# Patient Record
Sex: Male | Born: 1975 | Race: White | Hispanic: No | Marital: Married | Smoking: Never smoker
Health system: Southern US, Community
[De-identification: ages and names within clinical notes are randomized; demographics above are authoritative.]

---

## 2014-03-01 ENCOUNTER — Ambulatory Visit: Payer: Self-pay | Admitting: General Practice

## 2014-03-01 IMAGING — US ABDOMEN ULTRASOUND LIMITED
1 series · 14 of 25 positions shown · non-contrast
Comparison: None.

CLINICAL DATA: Right upper quadrant pain

EXAM:
US ABDOMEN LIMITED - RIGHT UPPER QUADRANT

[Series 1: abdomen ultrasound limited · 0.28mm/px · 14 of 48 slices shown]
[im 1/48]
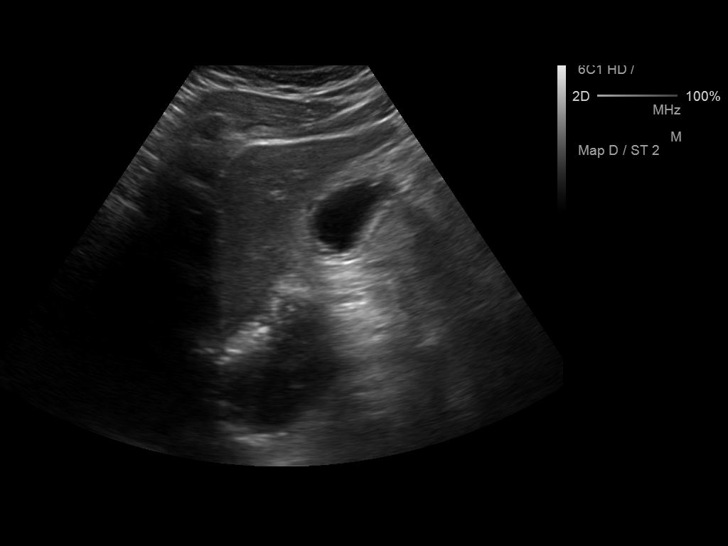
[im 4/48]
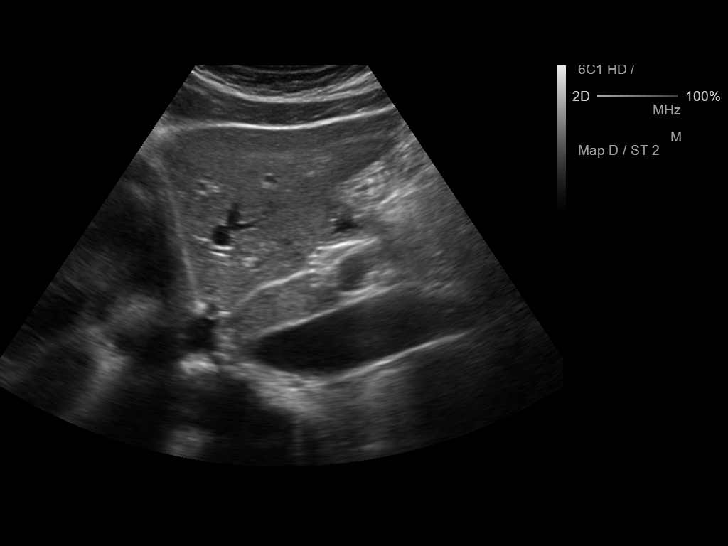
[im 8/48]
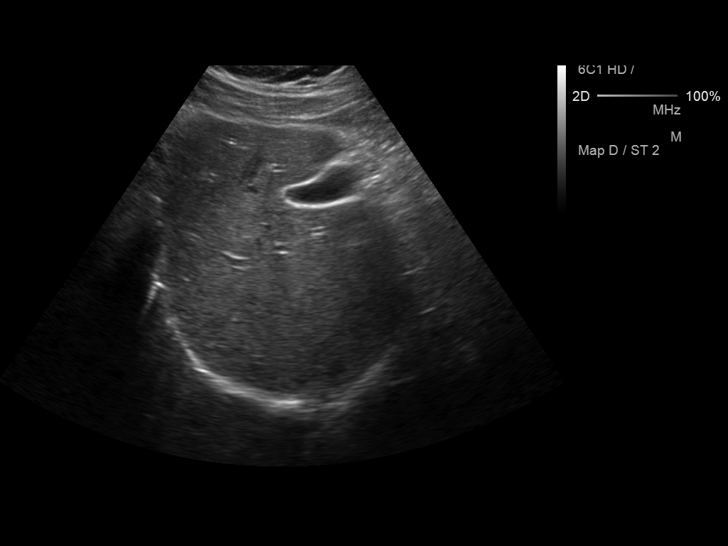
[im 12/48]
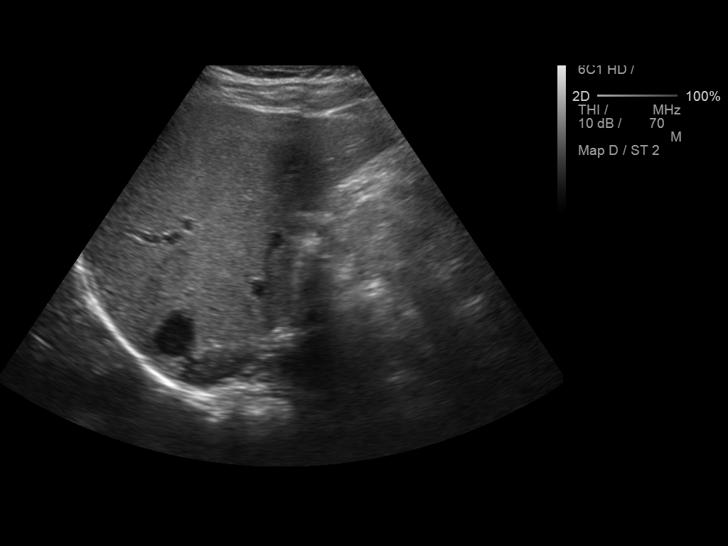
[im 16/48]
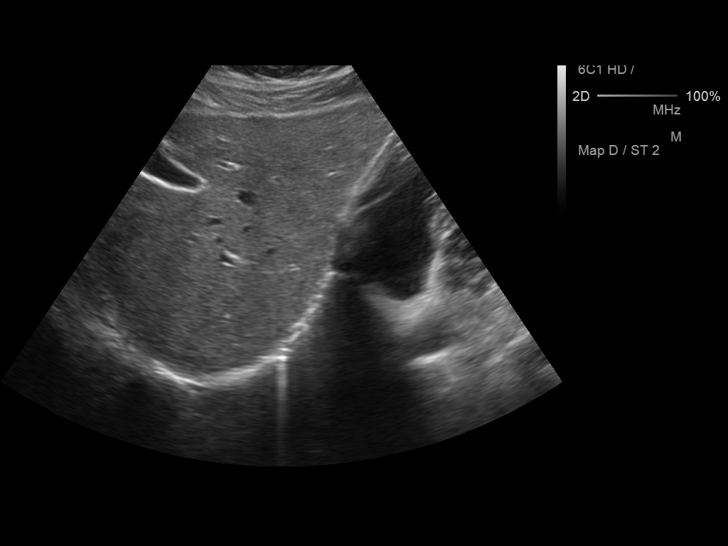
[im 18/48]
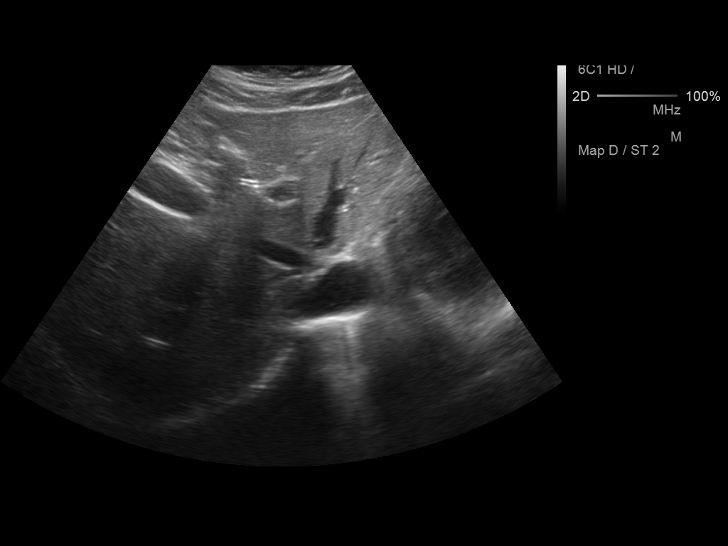
[im 22/48]
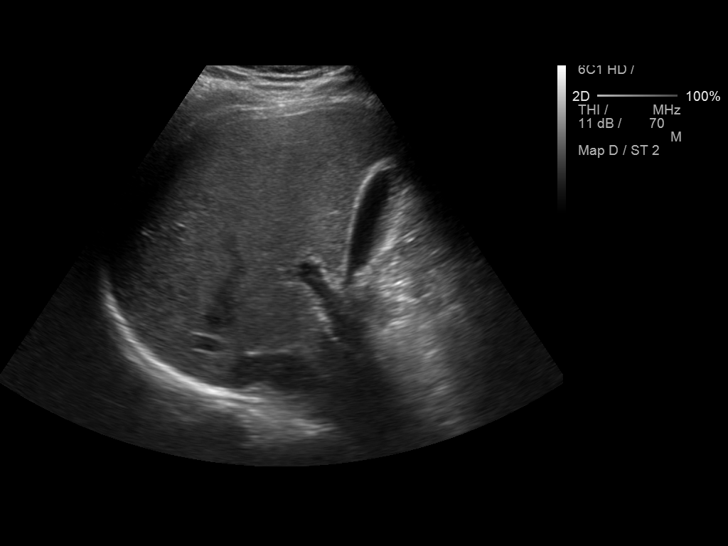
[im 26/48]
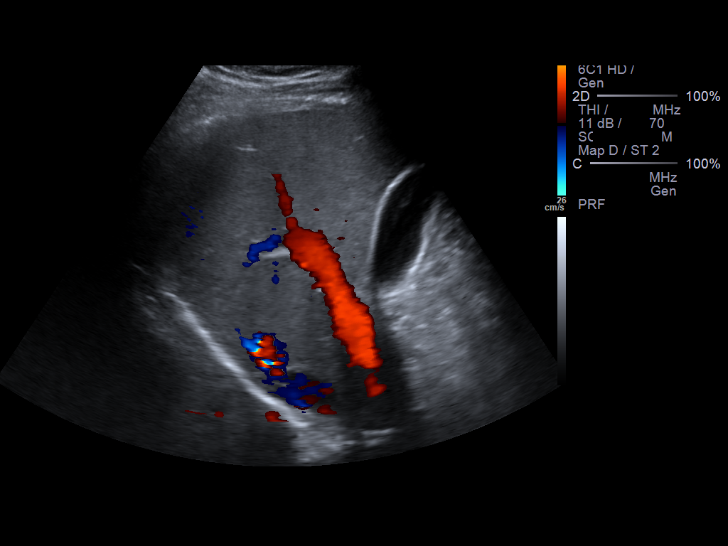
[im 30/48]
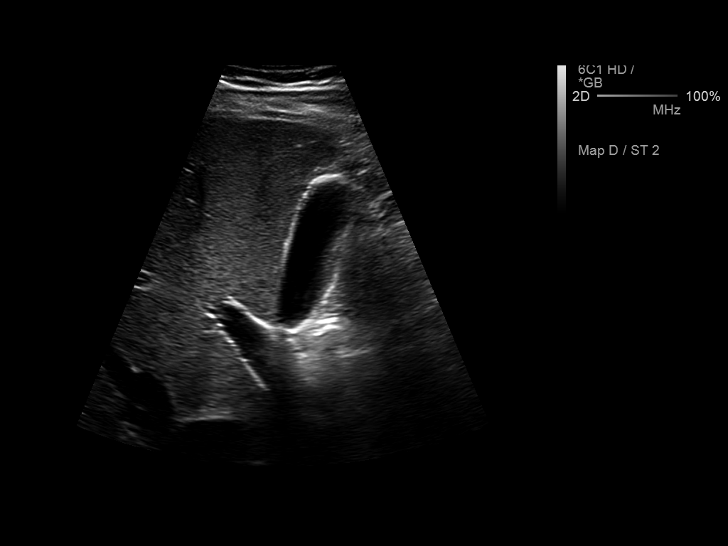
[im 32/48]
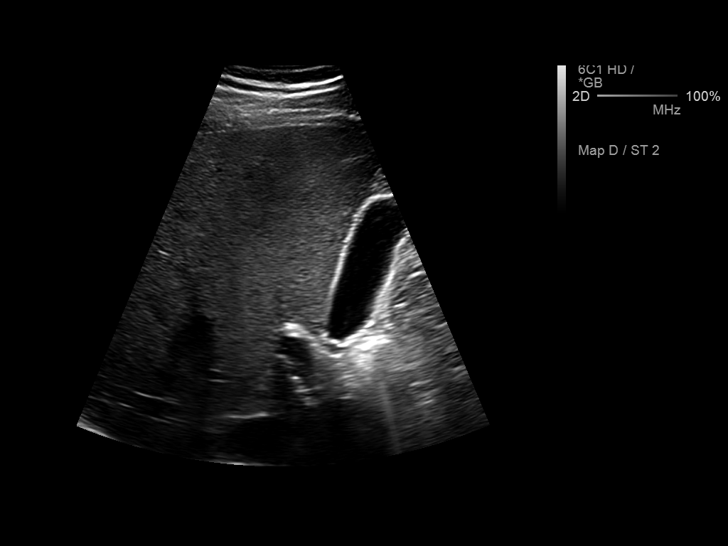
[im 36/48]
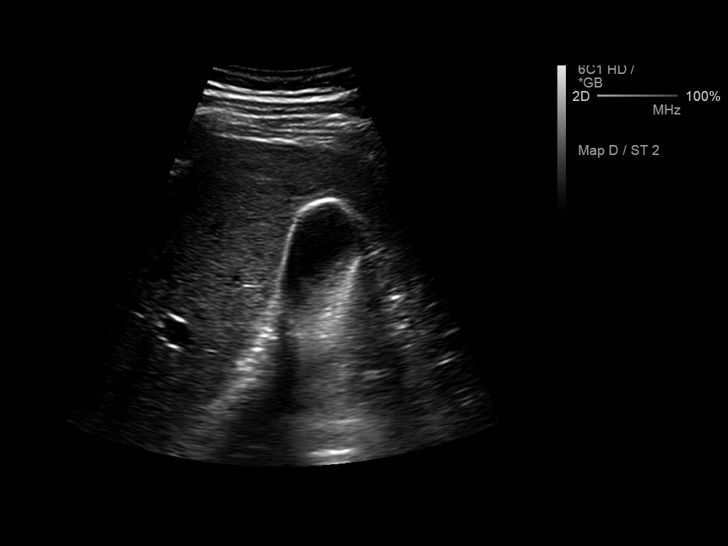
[im 40/48]
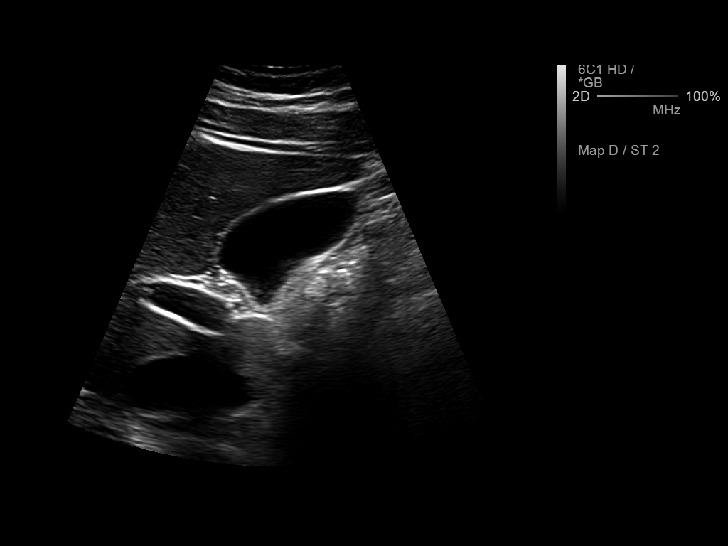
[im 44/48]
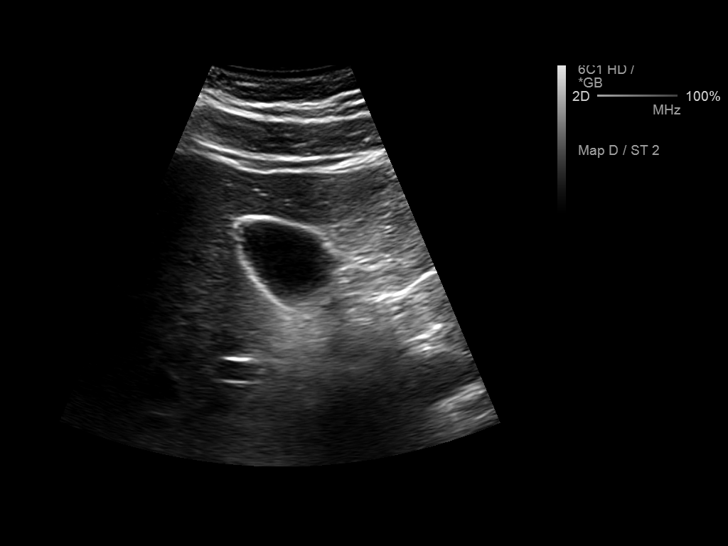
[im 48/48]
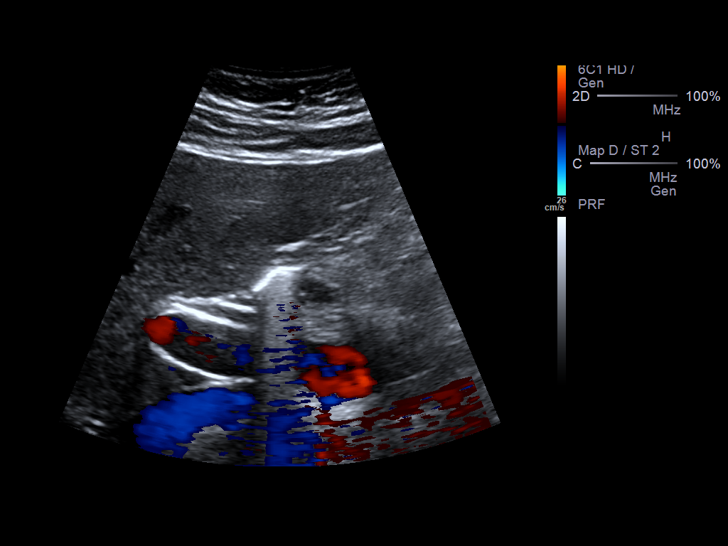

[14 of 25 positions shown; findings below may reference images not displayed]

FINDINGS: Gallbladder:

No gallstones or wall thickening visualized. No sonographic Murphy
sign noted.

Common bile duct:

Diameter: 4.1 mm

Liver:

No focal lesion identified. Within normal limits in parenchymal
echogenicity.
IMPRESSION: Normal

## 2015-09-12 ENCOUNTER — Other Ambulatory Visit: Payer: Self-pay | Admitting: Physician Assistant

## 2015-09-18 ENCOUNTER — Other Ambulatory Visit: Payer: Self-pay | Admitting: Emergency Medicine

## 2015-09-18 DIAGNOSIS — K219 Gastro-esophageal reflux disease without esophagitis: Secondary | ICD-10-CM

## 2015-09-18 MED ORDER — OMEPRAZOLE 20 MG PO CPDR: 20.0000 mg | DELAYED_RELEASE_CAPSULE | Freq: Every day | ORAL | Status: DC

## 2015-09-18 NOTE — Telephone Encounter (Signed)
Received a faxed medication request from [facility].  Please advise.  Thank you.

## 2015-09-18 NOTE — Telephone Encounter (Signed)
Needs fasting labs after this 1 refill

## 2015-10-10 ENCOUNTER — Encounter: Payer: Self-pay | Admitting: Physician Assistant

## 2015-10-10 ENCOUNTER — Ambulatory Visit: Payer: Self-pay | Admitting: Physician Assistant

## 2015-10-10 VITALS — BP 120/70 | HR 58 | Temp 98.4°F

## 2015-10-10 DIAGNOSIS — K219 Gastro-esophageal reflux disease without esophagitis: Secondary | ICD-10-CM

## 2015-10-10 DIAGNOSIS — J302 Other seasonal allergic rhinitis: Secondary | ICD-10-CM

## 2015-10-10 MED ORDER — FLUTICASONE PROPIONATE 50 MCG/ACT NA SUSP: 2.0000 | Freq: Every day | NASAL | Status: DC

## 2015-10-10 MED ORDER — OMEPRAZOLE 20 MG PO CPDR: 20.0000 mg | DELAYED_RELEASE_CAPSULE | Freq: Every day | ORAL | Status: DC

## 2015-10-10 NOTE — Progress Notes (Signed)
S: ? If can have rx for flonase, states zyrtec had stopped working and tried [employer] which really helped, also using omeprazole occasionally/rarely, but rx from last year has run out, has been dieting and exercising, lost 15lbs as started running again  O: vitals wnl, nad, lungs c t a, cv rrr  A: allergies, gerd  P: omeprazole 20mg , flonase, continue diet and exercise, return for yearly labs in Jan/FEb

## 2017-02-24 ENCOUNTER — Ambulatory Visit: Payer: Self-pay | Admitting: Physician Assistant

## 2017-02-24 ENCOUNTER — Encounter: Payer: Self-pay | Admitting: Physician Assistant

## 2017-02-24 ENCOUNTER — Encounter (INDEPENDENT_AMBULATORY_CARE_PROVIDER_SITE_OTHER): Payer: Self-pay

## 2017-02-24 VITALS — BP 125/80 | HR 62 | Temp 97.9°F | Ht 72.0 in | Wt 215.0 lb

## 2017-02-24 DIAGNOSIS — Z008 Encounter for other general examination: Secondary | ICD-10-CM

## 2017-02-24 DIAGNOSIS — Z0189 Encounter for other specified special examinations: Secondary | ICD-10-CM

## 2017-02-24 DIAGNOSIS — Z0000 Encounter for general adult medical examination without abnormal findings: Secondary | ICD-10-CM

## 2017-02-24 NOTE — Progress Notes (Signed)
S: pt here for wellness physical and biometrics for insurance purposes, states he noticed he has night sweats about 2x a week, does spray down well when working outside, no known tick bites;  no other complaints ros neg. 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

## 2017-02-24 NOTE — Addendum Note (Signed)
Addended by: Catha Brow T on: 02/24/2017 02:41 PM   Modules accepted: Orders

## 2017-02-27 LAB — LYME AB/WESTERN BLOT REFLEX
LYME DISEASE AB, QUANT, IGM: <0.8 index (ref 0.00–0.79)
Lyme IgG/IgM Ab: <0.91 {ISR} (ref 0.00–0.90)

## 2017-02-27 LAB — CMP12+LP+TP+TSH+6AC+PSA+CBC…
A/G RATIO: 2.2 (ref 1.2–2.2)
ALBUMIN: 5.1 g/dL (ref 3.5–5.5)
ALK PHOS: 72 IU/L (ref 39–117)
ALT: 33 IU/L (ref 0–44)
AST: 24 IU/L (ref 0–40)
BASOS ABS: 0.1 10*3/uL (ref 0.0–0.2)
BILIRUBIN TOTAL: 0.3 mg/dL (ref 0.0–1.2)
BUN/Creatinine Ratio: 12 (ref 9–20)
BUN: 12 mg/dL (ref 6–24)
Basos: 1 %
CHLORIDE: 101 mmol/L (ref 96–106)
CHOLESTEROL TOTAL: 190 mg/dL (ref 100–199)
Calcium: 10.2 mg/dL (ref 8.7–10.2)
Chol/HDL Ratio: 3.7 ratio (ref 0.0–5.0)
Creatinine, Ser: 1.02 mg/dL (ref 0.76–1.27)
EOS (ABSOLUTE): 0.2 10*3/uL (ref 0.0–0.4)
EOS: 3 %
Estimated CHD Risk: 0.6 times avg. (ref 0.0–1.0)
FREE THYROXINE INDEX: 1.6 (ref 1.2–4.9)
GFR calc non Af Amer: 90 mL/min/{1.73_m2} (ref >59)
GFR, EST AFRICAN AMERICAN: 104 mL/min/{1.73_m2} (ref >59)
GGT: 42 IU/L (ref 0–65)
Globulin, Total: 2.3 g/dL (ref 1.5–4.5)
Glucose: 103 mg/dL — ABNORMAL HIGH (ref 65–99)
HDL: 51 mg/dL (ref >39)
HEMOGLOBIN: 15.5 g/dL (ref 13.0–17.7)
Hematocrit: 45.9 % (ref 37.5–51.0)
IMMATURE GRANS (ABS): 0 10*3/uL (ref 0.0–0.1)
IMMATURE GRANULOCYTES: 0 %
IRON: 84 ug/dL (ref 38–169)
LDH: 168 IU/L (ref 121–224)
LDL CALC: 100 mg/dL — AB (ref 0–99)
LYMPHS: 41 %
Lymphocytes Absolute: 2.4 10*3/uL (ref 0.7–3.1)
MCH: 29.1 pg (ref 26.6–33.0)
MCHC: 33.8 g/dL (ref 31.5–35.7)
MCV: 86 fL (ref 79–97)
MONOCYTES: 8 %
Monocytes Absolute: 0.5 10*3/uL (ref 0.1–0.9)
NEUTROS ABS: 2.8 10*3/uL (ref 1.4–7.0)
NEUTROS PCT: 47 %
PLATELETS: 292 10*3/uL (ref 150–379)
PROSTATE SPECIFIC AG, SERUM: 0.8 ng/mL (ref 0.0–4.0)
Phosphorus: 3.5 mg/dL (ref 2.5–4.5)
Potassium: 4.9 mmol/L (ref 3.5–5.2)
RBC: 5.33 x10E6/uL (ref 4.14–5.80)
RDW: 14 % (ref 12.3–15.4)
Sodium: 143 mmol/L (ref 134–144)
T3 Uptake Ratio: 22 % — ABNORMAL LOW (ref 24–39)
T4, Total: 7.2 ug/dL (ref 4.5–12.0)
TOTAL PROTEIN: 7.4 g/dL (ref 6.0–8.5)
TSH: 2.67 u[IU]/mL (ref 0.450–4.500)
Triglycerides: 194 mg/dL — ABNORMAL HIGH (ref 0–149)
Uric Acid: 5.7 mg/dL (ref 3.7–8.6)
VLDL CHOLESTEROL CAL: 39 mg/dL (ref 5–40)
WBC: 5.8 10*3/uL (ref 3.4–10.8)

## 2017-02-27 LAB — HEPATITIS C ANTIBODY (REFLEX): HCV Ab: <0.1 s/co ratio (ref 0.0–0.9)

## 2017-02-27 LAB — HIV ANTIBODY (ROUTINE TESTING W REFLEX): HIV Screen 4th Generation wRfx: NONREACTIVE

## 2017-02-27 LAB — HCV COMMENT:

## 2017-02-27 LAB — VITAMIN D 25 HYDROXY (VIT D DEFICIENCY, FRACTURES): VIT D 25 HYDROXY: 39.7 ng/mL (ref 30.0–100.0)

## 2017-02-27 LAB — HEPATITIS B SURFACE ANTIBODY,QUALITATIVE: Hep B Surface Ab, Qual: REACTIVE

## 2017-02-27 LAB — SPECIMEN STATUS REPORT

## 2017-02-27 LAB — HGB A1C W/O EAG: HEMOGLOBIN A1C: 5.3 % (ref 4.8–5.6)

## 2017-09-29 ENCOUNTER — Ambulatory Visit: Payer: Self-pay | Admitting: Physician Assistant

## 2017-09-29 ENCOUNTER — Encounter: Payer: Self-pay | Admitting: Physician Assistant

## 2017-09-29 VITALS — BP 134/85 | HR 58 | Temp 98.5°F | Resp 16

## 2017-09-29 DIAGNOSIS — S39012A Strain of muscle, fascia and tendon of lower back, initial encounter: Secondary | ICD-10-CM

## 2017-09-29 MED ORDER — NAPROXEN 500 MG PO TABS: 500.0000 mg | ORAL_TABLET | Freq: Two times a day (BID) | ORAL | 0 refills | Status: DC

## 2017-09-29 MED ORDER — ORPHENADRINE CITRATE ER 100 MG PO TB12: 100.0000 mg | ORAL_TABLET | Freq: Two times a day (BID) | ORAL | 0 refills | Status: DC

## 2017-09-29 NOTE — Progress Notes (Signed)
   Subjective: Back pain     Patient ID: Tyler Stone, male    DOB: [birth year], 41 y.o.   MRN: [id number]  HPI Patient complain of bilateral low back pain secondary to hypertension flexion incident 2 days ago. Patient denies radicular component to his back pain. Patient denies bladder or bowel dysfunction. Patient rates the pain as a 5/10. Patient described a pain as "spasmatic". No palliative measures for complaint.  Review of Systems    seasonal rhinitis and GERD. Objective:   Physical Exam No obvious spinal deformity. No guarding palpation spinal processes. Patient is moderate guarding with guarding of the bilateral paraspinal muscle area. Patient has decreased range of motion with flexion and lateral movements. Patient has negative straight leg test. Patient has normal gait.       Assessment & Plan: Lumbar strain   Patient given discharge Instructions and a prescription for Norflex and naproxen. Patient advised to follow with no improvement 3-5 days.

## 2017-10-07 ENCOUNTER — Ambulatory Visit: Payer: Self-pay | Admitting: Emergency Medicine

## 2017-10-07 ENCOUNTER — Encounter: Payer: Self-pay | Admitting: Emergency Medicine

## 2017-10-07 VITALS — BP 110/70 | HR 68 | Temp 98.5°F | Resp 16

## 2017-10-07 DIAGNOSIS — S39012D Strain of muscle, fascia and tendon of lower back, subsequent encounter: Secondary | ICD-10-CM

## 2017-10-07 MED ORDER — METHOCARBAMOL 500 MG PO TABS: ORAL_TABLET | ORAL | 0 refills | Status: DC

## 2017-10-07 MED ORDER — NAPROXEN 500 MG PO TABS: 500.0000 mg | ORAL_TABLET | Freq: Two times a day (BID) | ORAL | 0 refills | Status: DC

## 2017-10-07 NOTE — Progress Notes (Signed)
S:  Here with back pain.  Was seen last week for the same. Patient states that he was doing  On the medications prescribed for him. This morning when he got out of bed he began having increased back pain in the same area.  He states movement increases pain especially with bending. He denies any paresthesias, radiculopathy, incontinence of bowel or bladder. Patient does not have any muscle realants at this time. He denies any previous urinary symptoms. This pain is the same as he experienced before. O:  Back without gross deformity.  Range of motion is guarded in all planes secondary to discomfort.  Moderate paraspinous tenderness right side lumbar area to palpation. No point tenderness to the vertebral bodies. No CVA tenderness.  Straight leg raises are negative. Reflexes are equal at 2+ bilaterally.  Gait is without assistance and wants patient is up walking is steady. A:  Lumbar strain P:  Patient is continue to take naproxen bid with food.  Robaxin 500mg  1-2 tablets q 6 hours for muscle spasms. He will decrease his work out training for his marathon. No running at this time.Walking only. Ice and heat to his back.If not improving would consider lumbar spine x-rays on next visit.

## 2017-12-12 ENCOUNTER — Ambulatory Visit: Payer: Self-pay

## 2017-12-15 ENCOUNTER — Ambulatory Visit: Payer: Self-pay | Admitting: Emergency Medicine

## 2017-12-15 VITALS — BP 110/72 | HR 60 | Temp 97.8°F | Ht 72.0 in | Wt 212.0 lb

## 2017-12-15 DIAGNOSIS — Z0000 Encounter for general adult medical examination without abnormal findings: Secondary | ICD-10-CM

## 2017-12-15 DIAGNOSIS — Z299 Encounter for prophylactic measures, unspecified: Secondary | ICD-10-CM

## 2017-12-15 NOTE — Progress Notes (Signed)
Subjective: 42 year old male, here for annual biometrics screening. [place] No complaints. He had flu symptoms a week ago, now resolved. Had low back strain 2 months ago, since resolved. He exercises regularly with cardio and weights and now he is exercising with core exercises.  Objective: No distress. BP 110/72, pulse 60, temp 97.8, oxygen saturation 99% room air, weight 212 pounds, height 6 feet, weight is 34 inches. He appears muscular and fit. HEENT, neck, heart, lungs, abdomen, extremities, musculoskeletal, all within normal limits.  Assessment: Annual biometrics screen. We discussed health issues. Routine labs drawn. Reviewed immunizations, and he states last tetanus shot 2010, and I offered booster shot for tetanus, but he declined right now and he'll check his records and he prefers to have tetanus booster shot next year.

## 2017-12-16 LAB — CMP12+LP+TP+TSH+6AC+PSA+CBC…
ALT: 110 IU/L — ABNORMAL HIGH (ref 0–44)
AST: 53 IU/L — ABNORMAL HIGH (ref 0–40)
Albumin/Globulin Ratio: 2 (ref 1.2–2.2)
Albumin: 4.6 g/dL (ref 3.5–5.5)
Alkaline Phosphatase: 112 IU/L (ref 39–117)
BUN/Creatinine Ratio: 10 (ref 9–20)
BUN: 10 mg/dL (ref 6–24)
Basophils Absolute: 0 10*3/uL (ref 0.0–0.2)
Basos: 1 %
Bilirubin Total: 0.3 mg/dL (ref 0.0–1.2)
Calcium: 9.8 mg/dL (ref 8.7–10.2)
Chloride: 104 mmol/L (ref 96–106)
Chol/HDL Ratio: 2.9 ratio (ref 0.0–5.0)
Cholesterol, Total: 135 mg/dL (ref 100–199)
Creatinine, Ser: 0.96 mg/dL (ref 0.76–1.27)
EOS (ABSOLUTE): 0.1 10*3/uL (ref 0.0–0.4)
Eos: 2 %
Estimated CHD Risk: <0.5 times avg. (ref 0.0–1.0)
Free Thyroxine Index: 1.3 (ref 1.2–4.9)
GFR calc Af Amer: 111 mL/min/{1.73_m2} (ref >59)
GFR calc non Af Amer: 96 mL/min/{1.73_m2} (ref >59)
GGT: 86 IU/L — ABNORMAL HIGH (ref 0–65)
Globulin, Total: 2.3 g/dL (ref 1.5–4.5)
Glucose: 95 mg/dL (ref 65–99)
HDL: 46 mg/dL (ref >39)
Hematocrit: 43.6 % (ref 37.5–51.0)
Hemoglobin: 14.5 g/dL (ref 13.0–17.7)
Immature Grans (Abs): 0 10*3/uL (ref 0.0–0.1)
Immature Granulocytes: 0 %
Iron: 50 ug/dL (ref 38–169)
LDH: 189 IU/L (ref 121–224)
LDL Calculated: 66 mg/dL (ref 0–99)
Lymphocytes Absolute: 1.9 10*3/uL (ref 0.7–3.1)
Lymphs: 49 %
MCH: 28.7 pg (ref 26.6–33.0)
MCHC: 33.3 g/dL (ref 31.5–35.7)
MCV: 86 fL (ref 79–97)
Monocytes Absolute: 0.6 10*3/uL (ref 0.1–0.9)
Monocytes: 16 %
Neutrophils Absolute: 1.3 10*3/uL — ABNORMAL LOW (ref 1.4–7.0)
Neutrophils: 32 %
Phosphorus: 3.6 mg/dL (ref 2.5–4.5)
Platelets: 237 10*3/uL (ref 150–379)
Potassium: 5.2 mmol/L (ref 3.5–5.2)
Prostate Specific Ag, Serum: 0.6 ng/mL (ref 0.0–4.0)
RBC: 5.06 x10E6/uL (ref 4.14–5.80)
RDW: 13.9 % (ref 12.3–15.4)
Sodium: 143 mmol/L (ref 134–144)
T3 Uptake Ratio: 19 % — ABNORMAL LOW (ref 24–39)
T4, Total: 6.6 ug/dL (ref 4.5–12.0)
TSH: 1.98 u[IU]/mL (ref 0.450–4.500)
Total Protein: 6.9 g/dL (ref 6.0–8.5)
Triglycerides: 113 mg/dL (ref 0–149)
Uric Acid: 4.6 mg/dL (ref 3.7–8.6)
VLDL Cholesterol Cal: 23 mg/dL (ref 5–40)
WBC: 4 10*3/uL (ref 3.4–10.8)

## 2017-12-16 LAB — VITAMIN B12: Vitamin B-12: 1953 pg/mL — ABNORMAL HIGH (ref 232–1245)

## 2022-08-20 LAB — COLOGUARD: COLOGUARD: NEGATIVE

## 2024-12-01 ENCOUNTER — Encounter: Payer: Self-pay | Admitting: Emergency Medicine

## 2024-12-01 ENCOUNTER — Other Ambulatory Visit: Payer: Self-pay

## 2024-12-01 ENCOUNTER — Inpatient Hospital Stay
Admission: EM | Admit: 2024-12-01 | Discharge: 2024-12-03 | Disposition: A | Source: Home / Self Care | Attending: Emergency Medicine | Admitting: Emergency Medicine

## 2024-12-01 DIAGNOSIS — R7989 Other specified abnormal findings of blood chemistry: Secondary | ICD-10-CM

## 2024-12-01 DIAGNOSIS — R110 Nausea: Secondary | ICD-10-CM

## 2024-12-01 DIAGNOSIS — R1031 Right lower quadrant pain: Secondary | ICD-10-CM

## 2024-12-01 DIAGNOSIS — K3580 Unspecified acute appendicitis: Principal | ICD-10-CM | POA: Diagnosis present

## 2024-12-01 LAB — CBC
HCT: 40.7 % (ref 39.0–52.0)
Hemoglobin: 13.9 g/dL (ref 13.0–17.0)
MCH: 28.8 pg (ref 26.0–34.0)
MCHC: 34.2 g/dL (ref 30.0–36.0)
MCV: 84.3 fL (ref 80.0–100.0)
Platelets: 294 10*3/uL (ref 150–400)
RBC: 4.83 MIL/uL (ref 4.22–5.81)
RDW: 12.5 % (ref 11.5–15.5)
WBC: 11.1 10*3/uL — ABNORMAL HIGH (ref 4.0–10.5)
nRBC: 0 % (ref 0.0–0.2)

## 2024-12-01 LAB — COMPREHENSIVE METABOLIC PANEL WITH GFR
ALT: 86 U/L — ABNORMAL HIGH (ref 0–44)
AST: 43 U/L — ABNORMAL HIGH (ref 15–41)
Albumin: 4.7 g/dL (ref 3.5–5.0)
Alkaline Phosphatase: 105 U/L (ref 38–126)
Anion gap: 12 (ref 5–15)
BUN: 14 mg/dL (ref 6–20)
CO2: 29 mmol/L (ref 22–32)
Calcium: 10.2 mg/dL (ref 8.9–10.3)
Chloride: 99 mmol/L (ref 98–111)
Creatinine, Ser: 0.96 mg/dL (ref 0.61–1.24)
GFR, Estimated: >60 mL/min
Glucose, Bld: 108 mg/dL — ABNORMAL HIGH (ref 70–99)
Potassium: 4.6 mmol/L (ref 3.5–5.1)
Sodium: 140 mmol/L (ref 135–145)
Total Bilirubin: 0.5 mg/dL (ref 0.0–1.2)
Total Protein: 7.5 g/dL (ref 6.5–8.1)

## 2024-12-01 LAB — LIPASE, BLOOD: Lipase: 38 U/L (ref 11–51)

## 2024-12-01 NOTE — ED Triage Notes (Signed)
 RIght sided abdominal pain with low grade temp since Monday morning.  No n/v/d or shob. Pt states his wife wanted him to come in for eval.

## 2024-12-01 NOTE — ED Provider Notes (Signed)
 [facility] Provider Note    Event Date/Time   First MD Initiated Contact with Patient 12/01/24 2205     (approximate)   History   Abdominal Pain   HPI  Tyler Stone is a 49 y.o. male with history of MDD, hyperlipidemia, presenting with right lower quad abdominal pain.  States has been ongoing for 3 days, associate with nausea.  He denies any history kidney stones, no diarrhea or urinary symptoms.      Physical Exam   Triage Vital Signs: ED Triage Vitals  Encounter Vitals Group     BP 12/01/24 1750 (!) 150/97     Girls Systolic BP Percentile --      Girls Diastolic BP Percentile --      Boys Systolic BP Percentile --      Boys Diastolic BP Percentile --      Pulse Rate 12/01/24 1750 69     Resp 12/01/24 1750 18     Temp 12/01/24 1750 98.5 F (36.9 C)     Temp Source 12/01/24 1750 Oral     SpO2 12/01/24 1750 99 %     Weight --      Height --      Head Circumference --      Peak Flow --      Pain Score 12/01/24 1749 5     Pain Loc --      Pain Education --      Exclude from Growth Chart --     Most recent vital signs: Vitals:   12/01/24 1750 12/01/24 2225  BP: (!) 150/97 (!) 153/104  Pulse: 69 69  Resp: 18 16  Temp: 98.5 F (36.9 C)   SpO2: 99% 100%     General: Awake, no distress.  CV:  Good peripheral perfusion.  Resp:  Normal effort.  Abd:  No distention.  Soft, no tenderness of right upper quadrant, he does have tenderness over right lower quadrant without guarding Other:  Nontoxic-appearing   ED Results / Procedures / Treatments   Labs (all labs ordered are listed, but only abnormal results are displayed) Labs Reviewed  COMPREHENSIVE METABOLIC PANEL WITH GFR - Abnormal; Notable for the following components:      Result Value   Glucose, Bld 108 (*)    AST 43 (*)    ALT 86 (*)    All other components within normal limits  CBC - Abnormal; Notable for the following components:   WBC 11.1 (*)    All other  components within normal limits  LIPASE, BLOOD  URINALYSIS, ROUTINE W REFLEX MICROSCOPIC      PROCEDURES:  Critical Care performed: No  Procedures   MEDICATIONS ORDERED IN ED: Medications - No data to display   IMPRESSION / MDM / ASSESSMENT AND PLAN / ED COURSE  I reviewed the triage vital signs and the nursing notes.                              Differential diagnosis includes, but is not limited to, colitis, diverticulitis, appendicitis, musculoskeletal pain, strain, nephrolithiasis.  Labs, CT.  Patient's presentation is most consistent with acute presentation with potential threat to life or bodily function.  Independent interpretation of labs and imaging below.  Signout pending CT imaging, if negative likely believe discharge home.    Clinical Course as of 12/01/24 2310  Wed Dec 01, 2024  2209 Reviewed labs, mild leukocytosis, electrolyte  severely deranged, AST and ALT are mildly elevated, rest of LFTs are normal, lipase is normal. [TT]    Clinical Course User Index [TT] Waymond, Lorelle Cummins, MD     FINAL CLINICAL IMPRESSION(S) / ED DIAGNOSES   Final diagnoses:  Right lower quadrant abdominal pain  Elevated LFTs  Nausea     Rx / DC Orders   ED Discharge Orders     None        Note:  This document was prepared using Dragon voice recognition software and may include unintentional dictation errors.    Waymond Lorelle Cummins, MD 12/01/24 [phone]

## 2024-12-01 NOTE — ED Provider Notes (Signed)
 11:00 PM  Assumed care at shift change.  Patient here with right lower quadrant abdominal pain.  CT abdomen pelvis, urine pending.  1:15 AM  Pt's CT scan reviewed and interpreted by myself and the radiologist and shows acute appendicitis with contained perforation and phlegmon.  No drainable abscess or pneumoperitoneum.  Nonsurgical abdominal exam here.  Patient has not required any pain medication and appears quite comfortable, nontoxic.  Will continue to keep him n.p.o., give IV fluids, pain and nausea medicine as needed and give IV antibiotics.  Discussed with Dr. Tye with general surgery.  He will admit patient to his service.  He will review imaging.  Appreciate his help.  2:00 AM  Dr. Tye has reviewed imaging.  Given appendix is already perforated, plan will be for IV antibiotics, liquid diet that will be advanced as tolerated and plan for surgical intervention in 6 to 8 weeks.   Earnie Rockhold, Josette SAILOR, DO 12/02/24 [phone]

## 2024-12-02 ENCOUNTER — Emergency Department

## 2024-12-02 DIAGNOSIS — K3580 Unspecified acute appendicitis: Secondary | ICD-10-CM | POA: Diagnosis present

## 2024-12-02 LAB — BASIC METABOLIC PANEL WITH GFR
Anion gap: 12 (ref 5–15)
BUN: 13 mg/dL (ref 6–20)
CO2: 25 mmol/L (ref 22–32)
Calcium: 9 mg/dL (ref 8.9–10.3)
Chloride: 102 mmol/L (ref 98–111)
Creatinine, Ser: 0.93 mg/dL (ref 0.61–1.24)
GFR, Estimated: >60 mL/min
Glucose, Bld: 100 mg/dL — ABNORMAL HIGH (ref 70–99)
Potassium: 4.1 mmol/L (ref 3.5–5.1)
Sodium: 139 mmol/L (ref 135–145)

## 2024-12-02 LAB — CBC
HCT: 37.6 % — ABNORMAL LOW (ref 39.0–52.0)
Hemoglobin: 12.8 g/dL — ABNORMAL LOW (ref 13.0–17.0)
MCH: 28.6 pg (ref 26.0–34.0)
MCHC: 34 g/dL (ref 30.0–36.0)
MCV: 84.1 fL (ref 80.0–100.0)
Platelets: 280 10*3/uL (ref 150–400)
RBC: 4.47 MIL/uL (ref 4.22–5.81)
RDW: 12.3 % (ref 11.5–15.5)
WBC: 9.7 10*3/uL (ref 4.0–10.5)
nRBC: 0 % (ref 0.0–0.2)

## 2024-12-02 LAB — URINALYSIS, ROUTINE W REFLEX MICROSCOPIC
Bilirubin Urine: NEGATIVE
Glucose, UA: NEGATIVE mg/dL
Hgb urine dipstick: NEGATIVE
Ketones, ur: NEGATIVE mg/dL
Leukocytes,Ua: NEGATIVE
Nitrite: NEGATIVE
Protein, ur: NEGATIVE mg/dL
Specific Gravity, Urine: 1.015 (ref 1.005–1.030)
pH: 6 (ref 5.0–8.0)

## 2024-12-02 LAB — HIV ANTIBODY (ROUTINE TESTING W REFLEX): HIV Screen 4th Generation wRfx: NONREACTIVE

## 2024-12-02 MED ORDER — DOCUSATE SODIUM 100 MG PO CAPS: 100.0000 mg | ORAL_CAPSULE | Freq: Two times a day (BID) | ORAL | Status: DC | PRN

## 2024-12-02 MED ORDER — ONDANSETRON 4 MG PO TBDP: 4.0000 mg | ORAL_TABLET | Freq: Four times a day (QID) | ORAL | Status: DC | PRN

## 2024-12-02 MED ORDER — PIPERACILLIN-TAZOBACTAM 3.375 G IVPB
3.3750 g | Freq: Three times a day (TID) | INTRAVENOUS | Status: DC
  Administered 2024-12-02 – 2024-12-03 (×4): 3.375 g via INTRAVENOUS
  Filled 2024-12-02 (×4): qty 50

## 2024-12-02 MED ORDER — ACETAMINOPHEN 325 MG PO TABS: 650.0000 mg | ORAL_TABLET | Freq: Three times a day (TID) | ORAL | Status: DC | PRN

## 2024-12-02 MED ORDER — ONDANSETRON HCL 4 MG/2ML IJ SOLN: 4.0000 mg | Freq: Four times a day (QID) | INTRAMUSCULAR | Status: DC | PRN

## 2024-12-02 MED ORDER — ENOXAPARIN SODIUM 40 MG/0.4ML IJ SOSY
40.0000 mg | PREFILLED_SYRINGE | INTRAMUSCULAR | Status: DC
  Administered 2024-12-03: 40 mg via SUBCUTANEOUS
  Filled 2024-12-02: qty 0.4

## 2024-12-02 MED ORDER — IOHEXOL 300 MG/ML  SOLN
100.0000 mL | Freq: Once | INTRAMUSCULAR | Status: AC | PRN
  Administered 2024-12-02: 100 mL via INTRAVENOUS

## 2024-12-02 MED ORDER — MORPHINE SULFATE (PF) 2 MG/ML IV SOLN
2.0000 mg | INTRAVENOUS | Status: DC | PRN
  Administered 2024-12-02 (×2): 2 mg via INTRAVENOUS
  Filled 2024-12-02 (×3): qty 1

## 2024-12-02 MED ORDER — PIPERACILLIN-TAZOBACTAM 3.375 G IVPB 30 MIN
3.3750 g | Freq: Once | INTRAVENOUS | Status: AC
  Administered 2024-12-02: 3.375 g via INTRAVENOUS
  Filled 2024-12-02: qty 50

## 2024-12-02 MED ORDER — SODIUM CHLORIDE 0.9 % IV SOLN: INTRAVENOUS | Status: DC

## 2024-12-02 MED ORDER — ONDANSETRON HCL 4 MG/2ML IJ SOLN
4.0000 mg | Freq: Once | INTRAMUSCULAR | Status: AC
  Administered 2024-12-02: 4 mg via INTRAVENOUS
  Filled 2024-12-02: qty 2

## 2024-12-02 MED ORDER — HYDROCODONE-ACETAMINOPHEN 5-325 MG PO TABS
1.0000 | ORAL_TABLET | Freq: Three times a day (TID) | ORAL | Status: DC | PRN
  Administered 2024-12-02: 1 via ORAL
  Filled 2024-12-02: qty 1

## 2024-12-02 MED ORDER — TRAMADOL HCL 50 MG PO TABS: 50.0000 mg | ORAL_TABLET | Freq: Four times a day (QID) | ORAL | Status: DC | PRN

## 2024-12-02 MED ORDER — MORPHINE SULFATE (PF) 4 MG/ML IV SOLN
4.0000 mg | Freq: Once | INTRAVENOUS | Status: AC
  Administered 2024-12-02: 4 mg via INTRAVENOUS
  Filled 2024-12-02: qty 1

## 2024-12-02 NOTE — Progress Notes (Signed)
 [facility]- General Surgery  SURGICAL PROGRESS NOTE  Hospital Day(s): 0.   Interval History:  Doing well this morning.  Admits pain is better controlled with pain medication he has been receiving.  Denies any nausea or vomiting.  Has been tolerating clear liquid diet.  Vital signs in last 24 hours: [min-max] current  Temp:  [98.1 F (36.7 C)-99.2 F (37.3 C)] 98.1 F (36.7 C) (02/05 0503) Pulse Rate:  [57-73] 70 (02/05 0830) Resp:  [14-21] 14 (02/05 0730) BP: (115-153)/(75-104) 134/86 (02/05 0830) SpO2:  [97 %-100 %] 98 % (02/05 0830) Weight:  [94.8 kg] 94.8 kg (02/05 0252)     Height: 6' (182.9 cm) Weight: 94.8 kg (From O/V note dated 11/05/24) BMI (Calculated): 28.34   Intake/Output last 2 shifts:  02/04 0701 - 02/05 0700 In: 95.8 [I.V.:45.8; IV Piggyback:50] Out: -    Physical Exam:  Constitutional: alert, cooperative and no distress  Respiratory: breathing non-labored at rest  Cardiovascular: regular rate and sinus rhythm  Gastrointestinal: soft, mildly tender on right side of abdomen, and non-distended  Labs:     Latest Ref Rng & Units 12/02/2024    4:47 AM 12/01/2024    5:51 PM 12/15/2017    9:15 AM  CBC  WBC 4.0 - 10.5 K/uL 9.7  11.1  4.0   Hemoglobin 13.0 - 17.0 g/dL 87.1  86.0  85.4   Hematocrit 39.0 - 52.0 % 37.6  40.7  43.6   Platelets 150 - 400 K/uL 280  294  237       Latest Ref Rng & Units 12/02/2024    4:47 AM 12/01/2024    5:51 PM 12/15/2017    9:15 AM  CMP  Glucose 70 - 99 mg/dL 899  891  95   BUN 6 - 20 mg/dL 13  14  10    Creatinine 0.61 - 1.24 mg/dL 9.06  9.03  9.03   Sodium 135 - 145 mmol/L 139  140  143   Potassium 3.5 - 5.1 mmol/L 4.1  4.6  5.2   Chloride 98 - 111 mmol/L 102  99  104   CO2 22 - 32 mmol/L 25  29    Calcium 8.9 - 10.3 mg/dL 9.0  89.7  9.8   Total Protein 6.5 - 8.1 g/dL  7.5  6.9   Total Bilirubin 0.0 - 1.2 mg/dL  0.5  0.3   Alkaline Phos 38 - 126 U/L  105  112   AST 15 - 41 U/L  43  53   ALT 0 - 44 U/L  86  110     Imaging  studies: No new pertinent imaging studies   Assessment/Plan:  49 y.o. male with perforated appendicitis, complicated by pertinent comorbidities including hyperlipidemia and major depressive disorder.   - Stable vital signs, no fever not tachycardic, denies any worsening pain.  - Leukocytosis resolved 11.1 >> 9.7  - Has tolerated clear liquid diet, advance to full liquid diet  - Continue pain management and IV antibiotics  -- Kadee Philyaw Barrientos PA-C

## 2024-12-02 NOTE — ED Notes (Signed)
 Pt wife at bedside.

## 2024-12-02 NOTE — H&P (Signed)
 Subjective:   CC: Perforated appendicitis  HPI:  Tyler Stone is a 49 y.o. male who is consulted by [facility] for evaluation of  above cc.  Symptoms were first noted 3 days ago. Pain is sharp, initially started periumbilical and migrated right lower quadrant.  Associated with nausea, exacerbated by nothing specific     Past Medical History: Depression  Past Surgical History:  has no past surgical history on file.  Family History: family history includes Diabetes in his mother; Edema in his mother; Hypertension in his father; Stroke in his father.  Social History:  reports that he has never smoked. He has quit using smokeless tobacco.  His smokeless tobacco use included snuff. He reports current alcohol use. He reports that he does not use drugs.  Current Medications:  Prior to Admission medications  Not on File    Allergies:  Allergies as of 12/01/2024   (No Known Allergies)    ROS:  Pertinent negative and positives noted in HPI   Objective:     BP (!) 153/104   Pulse 69   Temp 98.5 F (36.9 C) (Oral)   Resp 16   SpO2 100%    Constitutional :  alert, cooperative, appears stated age, and no distress  Respiratory:  Clear to auscultation bilaterally  Cardiovascular:  Regular rate and rhythm  Gastrointestinal: Soft, no guarding, tenderness to palpation right lower quadrant.   Skin: Cool and moist  Psychiatric: Normal affect, non-agitated, not confused       LABS:     Latest Ref Rng & Units 12/01/2024    5:51 PM 12/15/2017    9:15 AM 02/24/2017    9:42 AM  CMP  Glucose 70 - 99 mg/dL 891  95  896   BUN 6 - 20 mg/dL 14  10  12    Creatinine 0.61 - 1.24 mg/dL 9.03  9.03  8.97   Sodium 135 - 145 mmol/L 140  143  143   Potassium 3.5 - 5.1 mmol/L 4.6  5.2  4.9   Chloride 98 - 111 mmol/L 99  104  101   CO2 22 - 32 mmol/L 29     Calcium 8.9 - 10.3 mg/dL 89.7  9.8  89.7   Total Protein 6.5 - 8.1 g/dL 7.5  6.9  7.4   Total Bilirubin 0.0 - 1.2 mg/dL 0.5  0.3  0.3   Alkaline  Phos 38 - 126 U/L 105  112  72   AST 15 - 41 U/L 43  53  24   ALT 0 - 44 U/L 86  110  33       Latest Ref Rng & Units 12/01/2024    5:51 PM 12/15/2017    9:15 AM 02/24/2017    9:42 AM  CBC  WBC 4.0 - 10.5 K/uL 11.1  4.0  5.8   Hemoglobin 13.0 - 17.0 g/dL 86.0  85.4  84.4   Hematocrit 39.0 - 52.0 % 40.7  43.6  45.9   Platelets 150 - 400 K/uL 294  237  292      RADS: EXAM: CT ABDOMEN AND PELVIS WITH CONTRAST 12/02/2024 12:32:34 AM   TECHNIQUE: CT of the abdomen and pelvis was performed with the administration of 100 mL of iohexol  (OMNIPAQUE ) 300 MG/ML solution. Multiplanar reformatted images are provided for review. Automated exposure control, iterative reconstruction, and/or weight-based adjustment of the mA/kV was utilized to reduce the radiation dose to as low as reasonably achievable.   COMPARISON: None available.  CLINICAL HISTORY: RLQ abdominal pain. Right lower quadrant abdominal pain.   FINDINGS:   LOWER CHEST: No acute abnormality.   LIVER: The liver is unremarkable.   GALLBLADDER AND BILE DUCTS: Gallbladder is unremarkable. No biliary ductal dilatation.   SPLEEN: No acute abnormality.   PANCREAS: No acute abnormality.   ADRENAL GLANDS: No acute abnormality.   KIDNEYS, URETERS AND BLADDER: No stones in the kidneys or ureters. No hydronephrosis. No perinephric or periureteral stranding. Urinary bladder is unremarkable.   GI AND BOWEL: Stomach demonstrates no acute abnormality. There is no bowel obstruction. Marked dilation of the appendix measuring 23 mm. Question contained perforation / phlegmon about the appendix (series 2 image 50). Tiny appendicolith is not definitively intraluminal (series 5 image 56). No drainable abscess.   PERITONEUM AND RETROPERITONEUM: No ascites. No free air.   VASCULATURE: Aorta is normal in caliber.   LYMPH NODES: No lymphadenopathy.   REPRODUCTIVE ORGANS: No acute abnormality.   BONES AND SOFT TISSUES: No  acute osseous abnormality. No focal soft tissue abnormality.   IMPRESSION: 1. Acute appendicitis. Question contained perforation/phlegmon about the base . No pneumoperitoneum or drainable abscess.   Electronically signed by: Norman Gatlin MD 12/02/2024 12:42 AM EST RP Workstation: [id number]   Assessment:      Acute appendicitis, perforated based on imaging review.   Plan:    Due to perforated nature, recommend IV antibiotics, then proceed with interval appendectomy in 6 to 8 weeks if he improves clinically.  Patient verbalized understand agrees with plan, understanding antibiotics failure can still lead to surgery during this admission.  Admit, clear liquid diet initially, IV antibiotics, IV fluid resuscitation, repeat serial labs and abdominal exam.  Will continue to monitor   labs/images/medications/previous chart entries reviewed personally and relevant changes/updates noted above.

## 2024-12-03 ENCOUNTER — Other Ambulatory Visit: Payer: Self-pay

## 2024-12-03 LAB — BASIC METABOLIC PANEL WITH GFR
Anion gap: 10 (ref 5–15)
BUN: 9 mg/dL (ref 6–20)
CO2: 25 mmol/L (ref 22–32)
Calcium: 9.3 mg/dL (ref 8.9–10.3)
Chloride: 103 mmol/L (ref 98–111)
Creatinine, Ser: 0.97 mg/dL (ref 0.61–1.24)
GFR, Estimated: >60 mL/min
Glucose, Bld: 102 mg/dL — ABNORMAL HIGH (ref 70–99)
Potassium: 4.6 mmol/L (ref 3.5–5.1)
Sodium: 138 mmol/L (ref 135–145)

## 2024-12-03 LAB — CBC
HCT: 36.6 % — ABNORMAL LOW (ref 39.0–52.0)
Hemoglobin: 12.7 g/dL — ABNORMAL LOW (ref 13.0–17.0)
MCH: 29.4 pg (ref 26.0–34.0)
MCHC: 34.7 g/dL (ref 30.0–36.0)
MCV: 84.7 fL (ref 80.0–100.0)
Platelets: 304 10*3/uL (ref 150–400)
RBC: 4.32 MIL/uL (ref 4.22–5.81)
RDW: 12.2 % (ref 11.5–15.5)
WBC: 9.5 10*3/uL (ref 4.0–10.5)
nRBC: 0 % (ref 0.0–0.2)

## 2024-12-03 MED ORDER — AMOXICILLIN-POT CLAVULANATE 875-125 MG PO TABS
1.0000 | ORAL_TABLET | Freq: Two times a day (BID) | ORAL | 0 refills | Status: AC
  Filled 2024-12-03: qty 10, 5d supply, fill #0

## 2024-12-03 NOTE — TOC CM/SW Note (Signed)
 Transition of Care [facility]) - Inpatient Brief Assessment   Patient Details  Name: Tyler Stone MRN: [id number] Date of Birth: [birth year]  [facility] CM/SW Contact:    Corean ONEIDA Haddock, RN Phone Number: 12/03/2024, 10:51 AM   Clinical Narrative:  Transition of Care Department [facility]) has reviewed patient and no TOC needs have been identified at this time.  If new patient transition needs arise, please place a TOC consult.   Transition of Care Asessment: Insurance and Status: Insurance coverage has been reviewed Patient has primary care physician: Yes     Prior/Current Home Services: No current home services Social Drivers of Health Review: SDOH reviewed no interventions necessary Readmission risk has been reviewed: Yes Transition of care needs: no transition of care needs at this time

## 2024-12-03 NOTE — Progress Notes (Signed)
 Discharge instructions were reviewed with patient. Questions were encouraged and answered. IV was removed. Belongings collected by patient.

## 2024-12-03 NOTE — Plan of Care (Signed)
" °  Problem: Education: Goal: Knowledge of General Education information will improve Description: Including pain rating scale, medication(s)/side effects and non-pharmacologic comfort measures 12/03/2024 1827 by Liliane Mallis, RN Outcome: Adequate for Discharge 12/03/2024 1827 by Vivan Vanderveer, RN Outcome: Progressing   Problem: Health Behavior/Discharge Planning: Goal: Ability to manage health-related needs will improve 12/03/2024 1827 by Harbert Fitterer, RN Outcome: Adequate for Discharge 12/03/2024 1827 by Helon Mad, RN Outcome: Progressing   Problem: Clinical Measurements: Goal: Ability to maintain clinical measurements within normal limits will improve 12/03/2024 1827 by Mickey Hebel, RN Outcome: Adequate for Discharge 12/03/2024 1827 by Helon Mad, RN Outcome: Progressing Goal: Will remain free from infection 12/03/2024 1827 by Olla Delancey, RN Outcome: Adequate for Discharge 12/03/2024 1827 by Helon Mad, RN Outcome: Progressing Goal: Diagnostic test results will improve 12/03/2024 1827 by Jonanthony Nahar, RN Outcome: Adequate for Discharge 12/03/2024 1827 by Helon Mad, RN Outcome: Progressing Goal: Respiratory complications will improve 12/03/2024 1827 by Marquisha Nikolov, RN Outcome: Adequate for Discharge 12/03/2024 1827 by Helon Mad, RN Outcome: Progressing Goal: Cardiovascular complication will be avoided 12/03/2024 1827 by Vermon Grays, RN Outcome: Adequate for Discharge 12/03/2024 1827 by Helon Mad, RN Outcome: Progressing   Problem: Activity: Goal: Risk for activity intolerance will decrease 12/03/2024 1827 by Maxton Noreen, RN Outcome: Adequate for Discharge 12/03/2024 1827 by Helon Mad, RN Outcome: Progressing   Problem: Nutrition: Goal: Adequate nutrition will be maintained 12/03/2024 1827 by Apple Dearmas, RN Outcome: Adequate for Discharge 12/03/2024 1827 by Helon Mad, RN Outcome:  Progressing   Problem: Coping: Goal: Level of anxiety will decrease 12/03/2024 1827 by Juvenal Umar, RN Outcome: Adequate for Discharge 12/03/2024 1827 by Helon Mad, RN Outcome: Progressing   Problem: Elimination: Goal: Will not experience complications related to bowel motility 12/03/2024 1827 by Helon Mad, RN Outcome: Adequate for Discharge 12/03/2024 1827 by Helon Mad, RN Outcome: Progressing Goal: Will not experience complications related to urinary retention 12/03/2024 1827 by Helon Mad, RN Outcome: Adequate for Discharge 12/03/2024 1827 by Helon Mad, RN Outcome: Progressing   Problem: Pain Managment: Goal: General experience of comfort will improve and/or be controlled 12/03/2024 1827 by Adelee Hannula, RN Outcome: Adequate for Discharge 12/03/2024 1827 by Helon Mad, RN Outcome: Progressing   Problem: Safety: Goal: Ability to remain free from injury will improve 12/03/2024 1827 by Elva Mauro, RN Outcome: Adequate for Discharge 12/03/2024 1827 by Helon Mad, RN Outcome: Progressing   Problem: Skin Integrity: Goal: Risk for impaired skin integrity will decrease 12/03/2024 1827 by Helon Mad, RN Outcome: Adequate for Discharge 12/03/2024 1827 by Helon Mad, RN Outcome: Progressing   "

## 2024-12-03 NOTE — Plan of Care (Signed)

## 2024-12-03 NOTE — Plan of Care (Signed)
  Problem: Education: Goal: Knowledge of General Education information will improve Description: Including pain rating scale, medication(s)/side effects and non-pharmacologic comfort measures Outcome: Progressing   Problem: Clinical Measurements: Goal: Diagnostic test results will improve Outcome: Progressing   Problem: Elimination: Goal: Will not experience complications related to bowel motility Outcome: Progressing Goal: Will not experience complications related to urinary retention Outcome: Progressing   Problem: Pain Managment: Goal: General experience of comfort will improve and/or be controlled Outcome: Progressing

## 2024-12-03 NOTE — Discharge Instructions (Signed)
" °  Diet: Resume home heart healthy regular diet.   Medications: Resume all home medications. For mild to moderate pain: acetaminophen  (Tylenol ) or ibuprofen (if no kidney disease). Combining Tylenol  with alcohol can substantially increase your risk of causing liver disease. Complete 5-day course of oral antibiotics as prescribed.   Call office [phone]) at any time if any questions, worsening pain, fevers/chills, or other concerns.  "

## 2024-12-03 NOTE — Discharge Summary (Signed)
 [facility]-General Surgery  SURGICAL DISCHARGE SUMMARY  Patient ID: Tyler Stone MRN: [id number] DOB/AGE: [birth year] 49 y.o.  Admit date: 12/01/2024 Discharge date: 12/03/2024  Discharge Diagnoses Patient Active Problem List   Diagnosis Date Noted   Acute appendicitis 12/02/2024    Consultants None   Procedures None    Hospital Course:  Patient presented to the [facility] ED on 12/01/2024 with right lower quadrant abdominal pain x 3 days associated with nausea.  In the ED, patient was hypertensive with a BP of 150/97, otherwise stable vital signs, no fever not tachycardic.  Labs indicated leukocytosis 11.1.  Mildly elevated bated LFTs, AST 43 and ALT 86.  Normal alkaline phos and total bili.  Lipase normal at 38.  CT of abdomen and pelvis showed marked dilation of the appendix measuring 23 mm with questionable contained perforation versus phlegmon.  No pneumoperitoneum or drainable abscess.  Dr. Tye reviewed imaging and recommended conservative management due to perforated appendix.  Patient was admitted for IV antibiotics and pain management.  On day 2, patient has already noticed a significant improvement in pain level.  Reports tolerating solid food, denies any nausea or vomiting.  Patient is to complete a 5-day course of oral antibiotics and will follow-up outpatient with Dr. Tye in 2 weeks to discuss surgical intervention in 6 to 8 weeks.   Physical Examination:  Constitutional: alert, in no acute distress Pulmonary: CTA bilaterally, normal breath sounds Cardiac: regular rate and rhythm Gastrointestinal: soft, mildly tender on right lower quadrant, and non-distended   Allergies as of 12/03/2024   No Known Allergies      Medication List     TAKE these medications    amoxicillin -clavulanate 875-125 MG tablet Commonly known as: AUGMENTIN  Take 1 tablet by mouth 2 (two) times daily for 5 days.   buPROPion ER 100 MG 12 hr tablet Commonly known as: WELLBUTRIN  SR Take 100 mg by mouth daily.   cholecalciferol 10 MCG (400 UNIT) Tabs tablet Commonly known as: VITAMIN D3 Take 400 Units by mouth daily.   multivitamin with minerals Tabs tablet Take 1 tablet by mouth daily.   naproxen  sodium 220 MG tablet Commonly known as: ALEVE  Take 220 mg by mouth daily as needed.   sertraline 50 MG tablet Commonly known as: ZOLOFT Take 50 mg by mouth daily.          Follow-up Information     [facility] Emergency Department at [facility]. Go to .   Specialty: Emergency Medicine Why: As needed, If symptoms worsen Contact information: [address] [place]  [address] [phone]        Tye Millet, DO Follow up in 2 week(s).   Specialties: General Surgery, Surgery Why: 2 weeks s/p perforated appendix, discuss interval appendectomy Contact information: [address] [place] [address] [phone]                  Time spent on discharge management including discussion of hospital course, clinical condition, outpatient instructions, prescriptions, and follow up with the patient and members of the medical team: >30 minutes  Devin Ganaway Barrientos PA-C
